# Patient Record
Sex: Female | Born: 1985 | Race: Black or African American | Hispanic: No | Marital: Single | Smoking: Current every day smoker
Health system: Southern US, Community
[De-identification: ages and names within clinical notes are randomized; demographics above are authoritative.]

## PROBLEM LIST (undated history)

## (undated) DIAGNOSIS — I10 Essential (primary) hypertension: Secondary | ICD-10-CM

## (undated) DIAGNOSIS — J45909 Unspecified asthma, uncomplicated: Secondary | ICD-10-CM

---
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
## 2005-02-03 ENCOUNTER — Other Ambulatory Visit: Admission: RE | Admit: 2005-02-03 | Discharge: 2005-02-03 | Payer: Self-pay | Admitting: Obstetrics and Gynecology

## 2005-04-06 ENCOUNTER — Inpatient Hospital Stay (HOSPITAL_COMMUNITY): Admission: AD | Admit: 2005-04-06 | Discharge: 2005-04-06 | Payer: Self-pay | Admitting: Obstetrics and Gynecology

## 2005-05-10 ENCOUNTER — Inpatient Hospital Stay (HOSPITAL_COMMUNITY): Admission: AD | Admit: 2005-05-10 | Discharge: 2005-05-14 | Payer: Self-pay | Admitting: Obstetrics and Gynecology

## 2006-10-22 ENCOUNTER — Emergency Department (HOSPITAL_COMMUNITY): Admission: EM | Admit: 2006-10-22 | Discharge: 2006-10-22 | Payer: Self-pay | Admitting: Emergency Medicine

## 2007-12-21 ENCOUNTER — Emergency Department (HOSPITAL_COMMUNITY): Admission: EM | Admit: 2007-12-21 | Discharge: 2007-12-21 | Payer: Self-pay | Admitting: Emergency Medicine

## 2008-06-04 ENCOUNTER — Emergency Department (HOSPITAL_COMMUNITY): Admission: EM | Admit: 2008-06-04 | Discharge: 2008-06-04 | Payer: Self-pay | Admitting: Emergency Medicine

## 2008-08-12 ENCOUNTER — Emergency Department (HOSPITAL_COMMUNITY): Admission: EM | Admit: 2008-08-12 | Discharge: 2008-08-12 | Payer: Self-pay | Admitting: Emergency Medicine

## 2009-11-15 ENCOUNTER — Inpatient Hospital Stay (HOSPITAL_COMMUNITY): Admission: AD | Admit: 2009-11-15 | Discharge: 2009-11-15 | Payer: Self-pay | Admitting: Obstetrics and Gynecology

## 2009-11-18 ENCOUNTER — Inpatient Hospital Stay (HOSPITAL_COMMUNITY): Admission: AD | Admit: 2009-11-18 | Discharge: 2009-11-20 | Payer: Self-pay | Admitting: Obstetrics and Gynecology

## 2009-11-18 ENCOUNTER — Encounter (INDEPENDENT_AMBULATORY_CARE_PROVIDER_SITE_OTHER): Payer: Self-pay | Admitting: Obstetrics and Gynecology

## 2010-02-20 IMAGING — CR DG CHEST 2V
2 series · 2 of 2 positions shown · non-contrast
Comparison: Report of a prior chest x-ray 04/18/2003

CLINICAL DATA: Cough

CHEST - 2 VIEW

[view not recorded (1 of 2)]
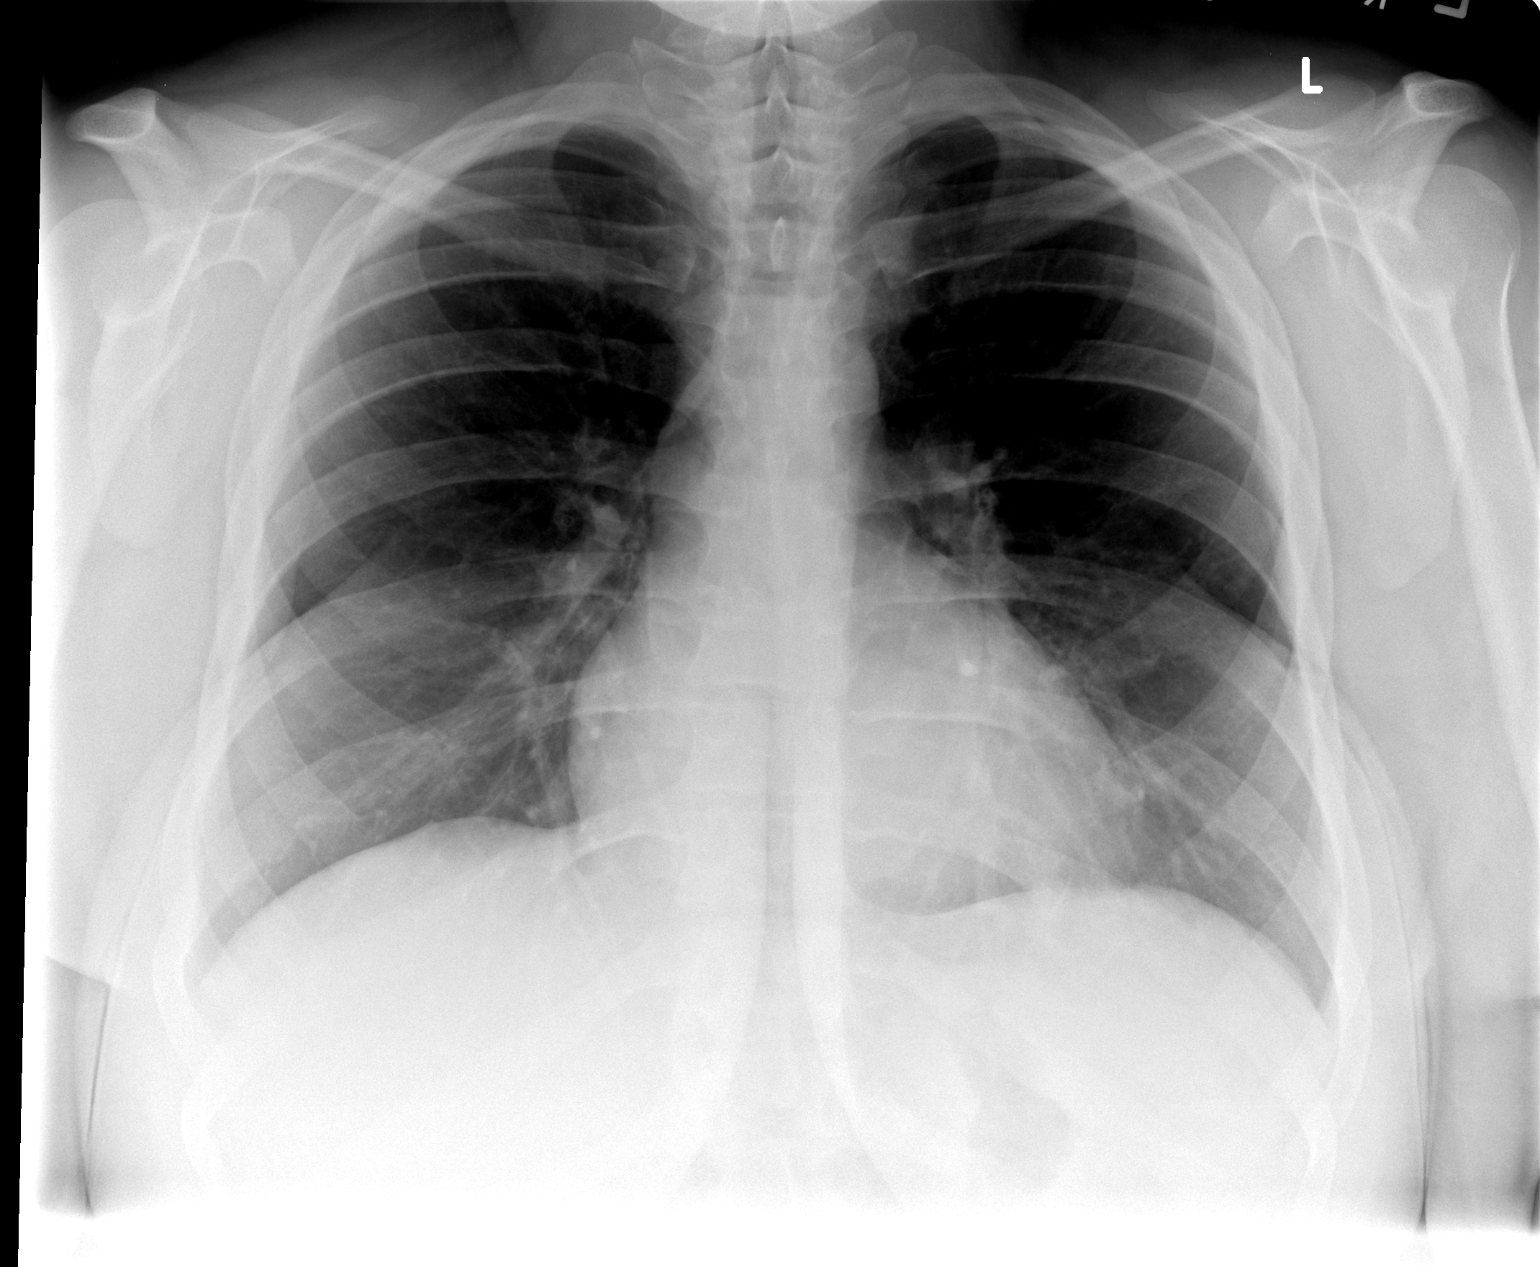

[view not recorded (2 of 2)]
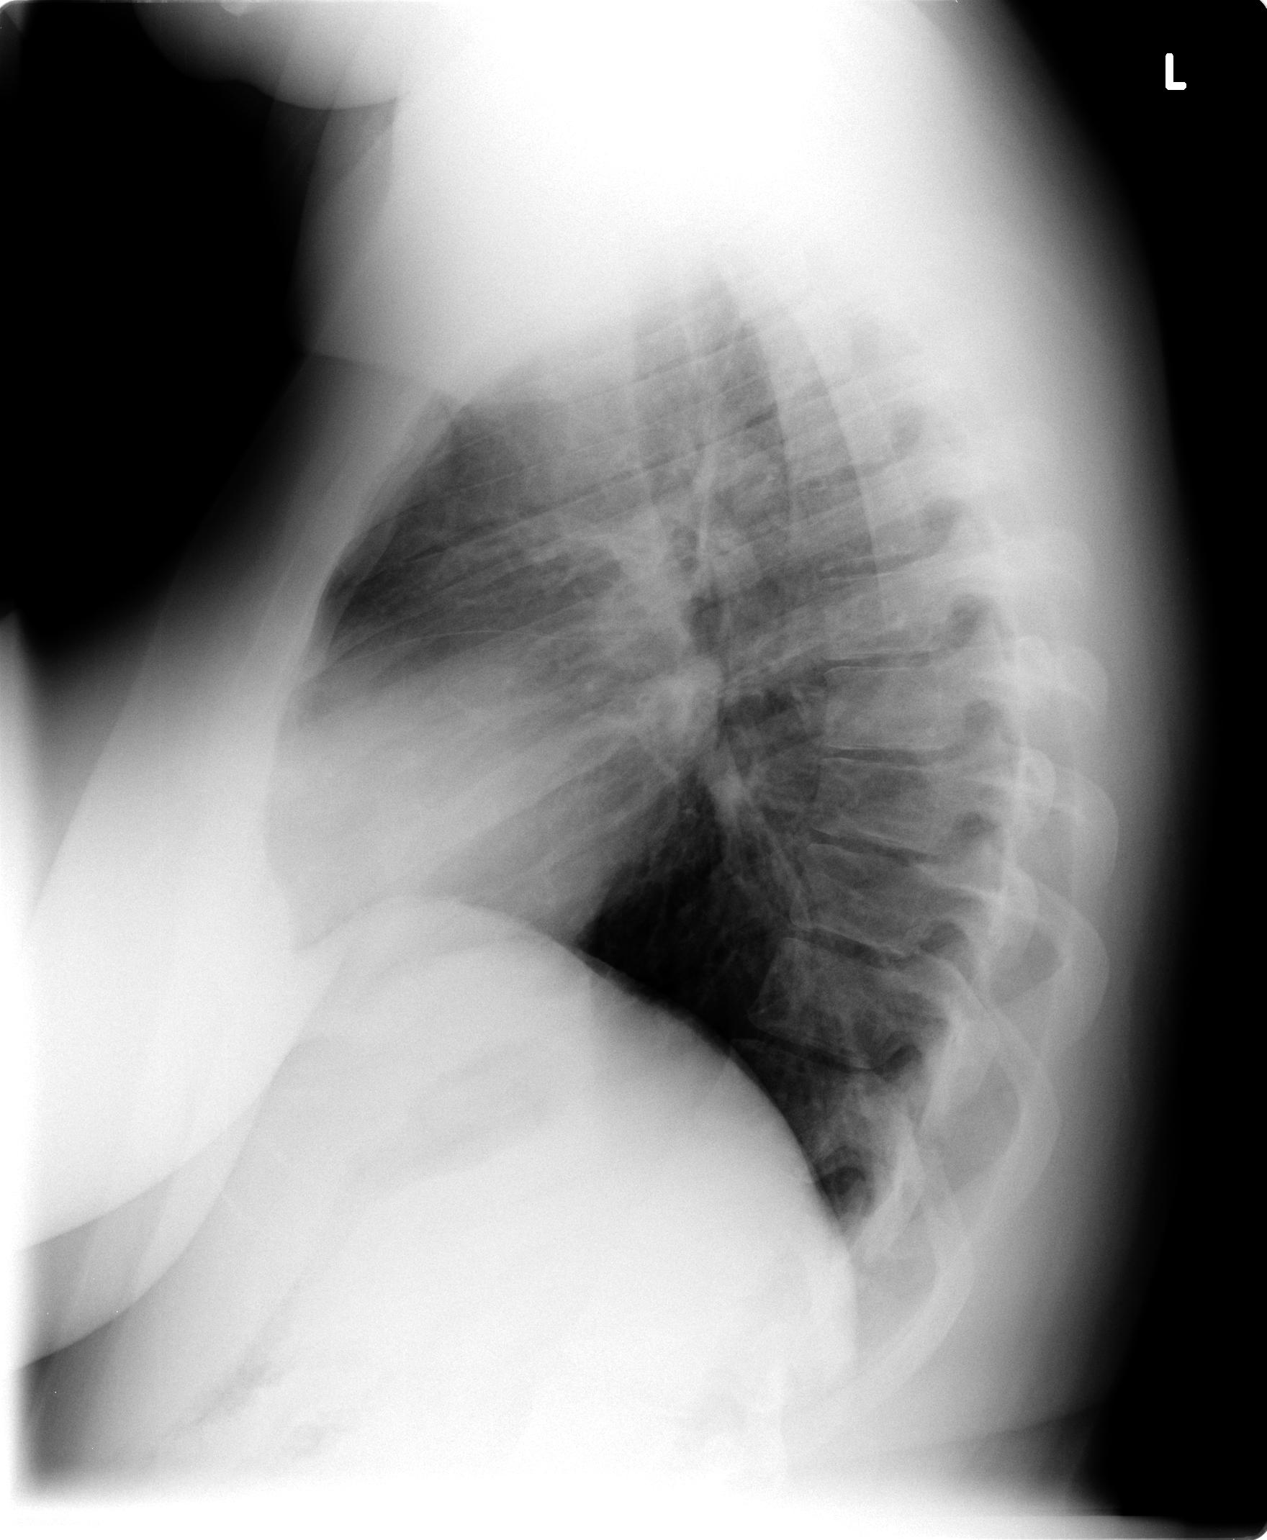

[2 of 2 positions shown; findings below may reference images not displayed]

FINDINGS: Heart and mediastinal contours normal.  Lungs clear.  No
pleural fluid.  Osseous structures and soft tissues unremarkable.
IMPRESSION: No acute or significant findings.

## 2010-06-13 LAB — URINALYSIS, ROUTINE W REFLEX MICROSCOPIC
Bilirubin Urine: NEGATIVE
Hgb urine dipstick: NEGATIVE
Ketones, ur: 15 mg/dL — AB
Protein, ur: NEGATIVE mg/dL
Specific Gravity, Urine: 1.025 (ref 1.005–1.030)
Urobilinogen, UA: 1 mg/dL (ref 0.0–1.0)

## 2010-06-13 LAB — CBC
Hemoglobin: 10.8 g/dL — ABNORMAL LOW (ref 12.0–15.0)
MCH: 29.8 pg (ref 26.0–34.0)
MCH: 31 pg (ref 26.0–34.0)
MCHC: 33.2 g/dL (ref 30.0–36.0)
MCHC: 34.3 g/dL (ref 30.0–36.0)
MCV: 89.5 fL (ref 78.0–100.0)
Platelets: 261 10*3/uL (ref 150–400)
RBC: 4.17 MIL/uL (ref 3.87–5.11)
RDW: 12.7 % (ref 11.5–15.5)

## 2010-07-08 LAB — BASIC METABOLIC PANEL
GFR calc non Af Amer: >60 mL/min (ref >60)
Potassium: 3.9 mEq/L (ref 3.5–5.1)
Sodium: 138 mEq/L (ref 135–145)

## 2010-07-08 LAB — URINALYSIS, ROUTINE W REFLEX MICROSCOPIC
Glucose, UA: NEGATIVE mg/dL
Hgb urine dipstick: NEGATIVE
Ketones, ur: NEGATIVE mg/dL
Protein, ur: NEGATIVE mg/dL

## 2010-07-08 LAB — PREGNANCY, URINE: Preg Test, Ur: NEGATIVE

## 2010-07-08 LAB — URINE MICROSCOPIC-ADD ON

## 2010-08-15 NOTE — Op Note (Signed)
Brandy [name], Brandy [name]              ACCOUNT NO.:  [id number]   MEDICAL RECORD NO.:  [id number]          PATIENT TYPE:  INP   LOCATION:  9119                          FACILITY:  [facility]   PHYSICIAN:  Crist Fat. Rivard, M.D. DATE OF BIRTH:  07/26/1985   DATE OF PROCEDURE:  05/11/2005  DATE OF DISCHARGE:                                 OPERATIVE REPORT   PREOPERATIVE DIAGNOSES:  1.  Intrauterine pregnancy at 40 weeks.  2.  Chronic hypertension.  3.  Nonreassuring fetal heart rate.   POSTOPERATIVE DIAGNOSES:  1.  Intrauterine pregnancy at 40 weeks.  2.  Chronic hypertension.  3.  Nonreassuring fetal heart rate.  4.  Body cord loop   PROCEDURE:  Primary low transverse cesarean section.   SURGEON:  Crist Fat. Rivard, M.D.   ASSISTANT:  Rhona Leavens, CNM   ESTIMATED BLOOD LOSS:  850 mL.   DESCRIPTION OF PROCEDURE:  After being informed of the planned procedure  with possible complications including bleeding, infection, injury to bowel,  bladder or ureters, informed consent was obtained.  The patient is taken to  OR #1 and preexisting epidural anesthesia is reinforced.  The patient is  prepped and draped in a sterile fashion and a Foley catheter is inserted in  her bladder.  After assessing adequate level of anesthesia, we infiltrated  the suprapubic area with 10 mL of Marcaine 0.25 and performed a Pfannenstiel  incision which was brought down sharply to the fascia.  The fascia is  incised in a low transverse fashion.  The linea alba is dissected.  The  abdomen is entered bluntly.  Visceral peritoneum is entered in a low  transverse fashion allowing [place] to safely retract bladder by developing a  bladder flap. Myometrium was entered in a low transverse fashion first with  knife and extended bluntly.  Amniotic fluid is clear.  We assisted the birth  of a female infant in occiput posterior position at 5:29 p.m.  Mouth and  nose were suctioned.  The baby is delivered while reducing a body  cord loop.  The cord is clamped with two Kelly clamps and sectioned, and the baby is  given to the pediatrician present in the room.  15 cc of blood is drawn from  the umbilical vein.  The patient is given a dose of Ancef 1 gram IV, and the  placenta is allowed to deliver spontaneously.  It is complete.  The cord has  three vessels, and the placenta is sent for cord blood donation.  Uterine  revision is negative. Myometrium was then repaired in two layers, first with  a running lock suture of 0 Vicryl, then with a Lembert suture of 0 Vicryl  imbricating the first one.  He did have a small extension of the incision  toward the lower cervix on a distance of about 1 cm which was easily  repaired through the normal lock suture.   Hemostasis was checked and completed with cautery on peritoneal edges.  Hemostasis was deemed adequate.  Both pericolic gutters were cleaned.  Both  tubes and ovaries assessed and normal.  Pelvis is profusely irrigated with  warm saline.  Hemostasis was adequate.  The fascia hemostasis is completed  with cautery and the fascia is closed with two running sutures of 1 Vicryl  meeting midline.  The incision is then irrigated with warm saline.  Hemostasis was completed with cautery and through a left contraincision a  #10 flat JP drain is placed and sutured to skin with 0 silk.  The skin of  the incision is then closed with a subcuticular suture of 3-0 Monocryl and  Steri-Strips.   Instruments and sponge count is complete x2.  Estimated blood loss is 850  cc.  The procedures were very well tolerated by the patient who is taken to  recovery room in a well and stable condition.   Little girl, name Vi'Caiyah, was born at 5:29 p.m. and received an Apgar of  9 at one minute and 9 at five minutes and weighs 6 pounds 13 ounces.      Crist Fat Rivard, M.D.  Electronically Signed     SAR/MEDQ  D:  05/11/2005  T:  05/11/2005  Job:  [id number]

## 2010-08-15 NOTE — H&P (Signed)
NAME:  Brandy [name], Brandy [name]              ACCOUNT NO.:  [id number]   MEDICAL RECORD NO.:  [id number]          PATIENT TYPE:  INP   LOCATION:  9167                          [facility]ACILITY:  [facility]   PHYSICIAN:  Naima A. Dillard, M.D. DATE OF BIRTH:  [birth year]   DATE OF ADMISSION:  05/10/2005  DATE OF DISCHARGE:                                HISTORY & PHYSICAL   Brandy [name] is a 25 year old gravida 1, para 0-0-0-0, who was admitted at 39  weeks and 6 days gestation for induction of labor secondary to chronic  hypertension on Aldomet.  The patient denies any leakage fluid, bleeding or  contractions.  The patient reports that her fetus was moving normally.  The  patient's pregnancy was remarkable for:  1.  Chronic hypertension diagnosed during pregnancy, started on Aldomet.  2.  Increased body mass index.  3.  History of asthma.  4.  History of Pap.   PRENATAL LABORATORY:  Hemoglobin 11.8, hematocrit 35.9 and platelets  293,000.  Blood type A positive, antibody screen negative, sickle cell trait  is negative, RPR is nonreactive, rubella titer immune, hepatitis B surface  antigen negative, HIV nonreactive, gonorrhea and Chlamydia negative.  Hemoglobin at 26 weeks 10.7, Glucola at 26 weeks 117.  Thirty-six weeks  group B strep was positive, gonorrhea and Chlamydia negative.   HISTORY OF PRESENT PREGNANCY:  The patient entered care at [redacted] weeks  gestation.  The patient's LMP of Aug 04, 2004 with an [facility] of May 11, 2005.  The patient underwent an ultrasound at 8 weeks and 5 days confirming  [facility] with the ultrasound [facility] May 10, 2005.  The patient's blood pressure  at first visit 160/78, repeat blood pressure at the next visit 140/100 and  160/101.  Therefore, the patient was started on Aldomet 500 mg b.i.d.  The  patient with a 24-hour protein and [redacted] weeks gestation 24-hour urine protein  48, remainder of labs were within normal limits.  Quad screen was within  normal limits.  Ultrasound at 18  weeks revealed size consistent with dates  fetus, a normal amniotic fluid volume, posterior placenta and no previa.  However, anatomy was limited secondary to maternal habitus.  Patient with  vulvovaginal candidiasis at 23 weeks.  Ultrasound repeated at 28 weeks with  an estimated fetal weight at the 78th percentile, a normal amniotic fluid  volume, vertex presentation.  The patient began biweekly NSTs at [redacted] weeks  gestation.  Biophysical profile secondary to nonreactive NST at 32 and 0/7  weeks, as well as 33 and 3/7 weeks.  Biophysical profile results 8/8 at this  time.  The patient with uterine contractions noted on the monitor at 34  weeks.  However, fetal fibronectin was found to be negative at that time, as  well as gonorrhea and Chlamydia.  Ultrasound repeated at 34 weeks with an  estimated fetal weight 57th percentile, a biophysical profile of 8/8 and  normal amniotic fluid.  NSTs remained reactive throughout the remainder of  the pregnancy.  Ultrasound obtained at 37 weeks revealing estimated fetal  weight of 45th percentile, normal amniotic  fluid and biophysical profile of  8/8.  The remainder of the patient's pregnancy has been unremarkable with  again biweekly NSTs with normal with reactive result.   OBSTETRIC HISTORY:  Pregnancy #1 current.   MEDICAL HISTORY:  A history of asthma and history of an increased body mass  index.   GYNECOLOGIC HISTORY:  The patient with a history of an abnormal Pap in  February of 2005 status post colposcopy.  Repeat Paps have been within  normal limits.  The patient denies any history of STDs.  The patient reports  regular monthly menses and denies any contraceptive use.   FAMILY HISTORY:  Father with hypertension, paternal grandmother COPD and  cancer of questionable type, and maternal grandmother colon cancer.   GENETIC HISTORY:  Paternal aunt sickle cell disease.  The paternal aunt and  uncle are twins, paternal uncle with mental  retardation.   SURGICAL HISTORY:  Negative.   ALLERGIES:  NO KNOWN DRUG ALLERGIES.   CURRENT MEDICATIONS:  1.  Aldomet 500 mg b.i.d.  2.  Prenatal vitamins daily.   SOCIAL HISTORY:  Patient is a single African-American female.  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  The father of the baby, Alecia Lemming  with 12 years of education, and is involved and supportive.  The patient  denies use of alcohol tobacco or street drugs.  The patient's domestic  violence screen is negative.  The patient is [employer] in her faith.   PHYSICAL EXAMINATION:  VITAL SIGNS:  The patient is afebrile.  Vital signs  stable.  Blood pressure 148/78.  HEENT:  Within normal limits.  LUNGS:  Clear.  HEART:  Regular rate and rhythm.  BREASTS:  Soft.  ABDOMEN:  Soft, gravid and nontender.  Fetal heart rate reactive with  accelerations noted.  No uterine contractions are noted.  CERVIX:  Deferred.  EXTREMITIES:  No edema and negative Homans' bilaterally.  Deep tendon  reflexes are 2+ and no clonus.   ASSESSMENT:  1.  Intrauterine pregnancy at 80 and 6/7 weeks.  2.  Chronic hypertension.  3.  Induction of labor.   PLAN:  1.  The patient admitted to birthing suites per orders of Dr. Dois Davenport Rivard.  2.  Orders per M.D.:  The patient will receive Cytotec on the evening of      admission, then Pitocin to be started on May 11, 2005 per protocol      and penicillin G for group B strep prophylaxis.      Rhona Leavens, CNM      Naima A. Normand Sloop, M.D.  Electronically Signed   NOS/MEDQ  D:  05/11/2005  T:  05/11/2005  Job:  [id number]

## 2010-08-15 NOTE — H&P (Signed)
Brandy [name], Brandy [name]                        ACCOUNT NO.:  [id number]   MEDICAL RECORD NO.:  [id number]                   PATIENT TYPE:  INP   LOCATION:  A312                                 FACILITY:  [facility]   PHYSICIAN:  Donna Bernard, M.D.             DATE OF BIRTH:  02-27-1986   DATE OF ADMISSION:  04/18/2003  DATE OF DISCHARGE:                                HISTORY & PHYSICAL   CHIEF COMPLAINT:  Cannot breathe.   SUBJECTIVE:  This patient is a 25 year old black female with a history of  early childhood asthma who presents to the emergency room on the day of  admission with complaints of severe shortness of breath.  The patient  developed cold, congestion, and drainage several days ago.  This was  followed soon by a steady cough.  The last 24 hours the patient has noted  shortness of breath steadily worsening. She has had tightness and wheezing.  She has had a difficult time sleeping last night.  She was brought in  through the emergency room and found to be in a fair amount of distress with  her wheezing.   ALLERGIES:  None known.   CHRONIC MEDICATIONS:  None.   SOCIAL HISTORY:  The patient lives with father.  A senior in school.  Also  significant for second-hand smoke exposure.   FAMILY HISTORY:  Positive for wheezing.   REVIEW OF SYSTEMS:  Otherwise negative.   PHYSICAL EXAMINATION:  VITAL SIGNS:  Afebrile.  BP 130/70, respiratory rate  36 breaths per minute initially.  GENERAL:  The patient is alert with mild-to-moderate tachypnea.  HEENT:  Moderate nasal congestion.  Pharynx normal.  TMs normal.  NECK:  Supple.  LUNGS:  Impressive bilateral wheezes, some rhonchi.  Mild tachypnea and  positive for accessory muscle use.  HEART:  Tachycardia noted.  ABDOMEN:  Soft.  EXTREMITIES:  Normal.  SKIN:  Normal.   SIGNIFICANT LABS:  CBC pending.  Blood gas pO2 of 58 pCO2 of 33 on no oxygen  supplementation.  Chest x-ray no obvious infiltrate.  Urine pregnancy test  negative.  CBC pending as noted.   IMPRESSION:  Probable viral syndrome with possible element of bronchitis and  exacerbation of asthma.  Of interest, the patient has history of early  childhood asthma, but has not had any significant wheezing for a number of  years.   PLAN:  IV antibiotics, steroids, fluids, frequent nebulizer treatments,  oxygen saturation monitoring, oxygen supplementation.  Further orders as  noted on the chart.     ___________________________________________                                         Donna Bernard, M.D.   WSL/MEDQ  D:  04/18/2003  T:  04/18/2003  Job:  [id number]

## 2010-08-15 NOTE — Discharge Summary (Signed)
Brandy [name], Brandy [name]                        ACCOUNT NO.:  [id number]   MEDICAL RECORD NO.:  [id number]                   PATIENT TYPE:  INP   LOCATION:  A312                                 FACILITY:  [facility]   PHYSICIAN:  Donna Bernard, M.D.             DATE OF BIRTH:  [birth year]   DATE OF ADMISSION:  04/18/2003  DATE OF DISCHARGE:  04/20/2003                                 DISCHARGE SUMMARY   FINAL DIAGNOSES:  1. Exacerbation of asthma.  2. Bronchitis.   FINAL DISPOSITION:  The patient discharged to home.   DISCHARGE MEDICATIONS:  1. Prednisone taper prescribed.  2. Ceftin 250 b.i.d. for seven days.  3. Albuterol two puffs q.4h. while wheezing and then p.r.n.   FOLLOW UP:  She is to follow up with Dr. Orson Aloe in one week.   INITIAL HISTORY AND PHYSICAL:  Please see H&P as dictated.   HOSPITAL COURSE:  This patient is a 25 year old black female, patient of Dr.  Orson Aloe, who presented the day of admission with severe shortness of  breath and wheezing.  She had known history of prior asthma.  She had a  several day prodrome of congestion and cough.  The cough was productive.   Workup in the emergency room revealed true hypoxia.  The patient had  multiple nebulizer treatments with some improvement.  Oxygen was  administered.  The patient was admitted to the hospital.  She was given IV  antibiotics, IV steroids and frequent nebulizer treatments.  Over the next  48 hours, she improved significantly.  The day of discharge is feeling  better.  She still has mild wheezes but was felt stable enough to head home.  The patient was discharged home with diagnosis and disposition as noted  above.     ___________________________________________                                         Donna Bernard, M.D.   WSL/MEDQ  D:  04/29/2003  T:  04/29/2003  Job:  [id number]

## 2010-08-15 NOTE — Discharge Summary (Signed)
NAME:  [name], Brandy              ACCOUNT NO.:  [id number]   MEDICAL RECORD NO.:  [id number]          PATIENT TYPE:  INP   LOCATION:  9119                          [facility]AC[facility]   PHYSICIAN:  Naima A. Dillard, M.D. DATE OF BIRTH:  02-22-1986   DATE OF ADMISSION:  05/10/2005  DATE OF DISCHARGE:  05/14/2005                                 DISCHARGE SUMMARY   ADMISSION DIAGNOSES:  1.  Intrauterine pregnancy at 73 and 6/[redacted] weeks gestation.  2.  Chronic hypertension.  3.  Elective induction of labor.   DISCHARGE DIAGNOSES:  1.  Intrauterine pregnancy at 33 and 6/[redacted] weeks gestation.  2.  Chronic hypertension.  3.  Elective induction of labor.  4.  Fetal heart rate decelerations.   HOSPITAL PROCEDURES:  1.  Cytotec induction of labor.  2.  Amniotomy.  3.  Pitocin augmentation of labor.  4.  Amnioinfusion.  5.  Epidural anesthesia.  6.  Primary low-transverse cesarean section.   HOSPITAL COURSE:  The patient was admitted for induction of labor secondary  to hypertension.  She was 2 to 3 cm on admission.  Amniotomy was performed  and she received 2 doses of Cytotec and then was started on Pitocin.  She  progressed to complete dilation.  Later that afternoon, was found to be in  OP presentation, pushed for some time with development of fetal heart rate  decelerations into the 50s and 60s with slow recovery, which did not improve  despite amnioinfusion and position changes.  The baby got to 0 station and  descended no further, and the decision was made to proceed with C-section,  which was performed under epidural anesthesia by Dr. Estanislado Pandy.  The baby was  found to have a cord around the body.  She was delivered of a female infant,  Apgars 9 and 9 weighing 6 pounds 13 ounces.  EBL was 850 mL.  She was taken  to recovery and then to mother-baby unit where she did well.  On postop day  #1, hemoglobin was 9.9, JP was draining a small amount, blood pressure was  150/85 and 150/90 on  Aldomet, and routine care was given.  On postop day #2,  she was doing well, blood pressures were 124-138/63-86, JP drained small  amount, pain was well controlled, she was ambulating and taking foods  without problems.  Postop day #3, she continued to improve and was ready to  go home.  Vital signs were stable and afebrile.  Abdomen was soft and  appropriately tender.  Chest was clear.  Heart rate:  Regular rate and  rhythm.  The incision was clean, dry and intact.  JP was removed by Dr.  Stefano Gaul and the patient was deemed to have received full benefit of her  hospital stay, and was discharged home.   DISCHARGE MEDICATIONS:  1.  Tylox 1 to 2 p.o. q.4h. p.r.n.  2.  Motrin 600 mg p.o. q.6h. p.r.n.   DISCHARGE LABORATORY:  White blood cell count 14, hemoglobin 9.9 and  platelets 212.  Chemistries were within normal limits.  Uric acid 6.2 and  AST was 19, ALT was 11.   DISCHARGE INSTRUCTIONS:  Per [facility] handout.  Discharge followup in 6 weeks or  p.r.n.      Brandy [name], C.N.M.      Naima A. Normand Sloop, M.D.  Electronically Signed    MLW/MEDQ  D:  05/14/2005  T:  05/14/2005  Job:  [id number]

## 2014-06-25 ENCOUNTER — Emergency Department (HOSPITAL_COMMUNITY)
Admission: EM | Admit: 2014-06-25 | Discharge: 2014-06-25 | Disposition: A | Discharge status: Home / Self Care | Payer: 59 | Attending: Emergency Medicine | Admitting: Emergency Medicine

## 2014-06-25 ENCOUNTER — Encounter (HOSPITAL_COMMUNITY): Payer: Self-pay | Admitting: Emergency Medicine

## 2014-06-25 DIAGNOSIS — I10 Essential (primary) hypertension: Secondary | ICD-10-CM | POA: Diagnosis not present

## 2014-06-25 DIAGNOSIS — R350 Frequency of micturition: Secondary | ICD-10-CM | POA: Diagnosis not present

## 2014-06-25 DIAGNOSIS — M7989 Other specified soft tissue disorders: Secondary | ICD-10-CM | POA: Insufficient documentation

## 2014-06-25 DIAGNOSIS — M545 Low back pain, unspecified: Secondary | ICD-10-CM

## 2014-06-25 DIAGNOSIS — Z720 Tobacco use: Secondary | ICD-10-CM | POA: Insufficient documentation

## 2014-06-25 DIAGNOSIS — J45909 Unspecified asthma, uncomplicated: Secondary | ICD-10-CM | POA: Diagnosis not present

## 2014-06-25 HISTORY — DX: Unspecified asthma, uncomplicated: J45.909

## 2014-06-25 HISTORY — DX: Essential (primary) hypertension: I10

## 2014-06-25 LAB — URINALYSIS, ROUTINE W REFLEX MICROSCOPIC
Bilirubin Urine: NEGATIVE
Glucose, UA: NEGATIVE mg/dL
Hgb urine dipstick: NEGATIVE
Ketones, ur: NEGATIVE mg/dL
Nitrite: NEGATIVE
Protein, ur: NEGATIVE mg/dL
Specific Gravity, Urine: >1.03 — ABNORMAL HIGH (ref 1.005–1.030)
Urobilinogen, UA: 0.2 mg/dL (ref 0.0–1.0)
pH: 6 (ref 5.0–8.0)

## 2014-06-25 LAB — URINE MICROSCOPIC-ADD ON

## 2014-06-25 MED ORDER — TRAMADOL HCL 50 MG PO TABS: 50.0000 mg | ORAL_TABLET | Freq: Four times a day (QID) | ORAL | Status: DC | PRN

## 2014-06-25 MED ORDER — NAPROXEN 375 MG PO TABS: 375.0000 mg | ORAL_TABLET | Freq: Two times a day (BID) | ORAL | Status: DC | PRN

## 2014-06-25 MED ORDER — DIAZEPAM 5 MG PO TABS
5.0000 mg | ORAL_TABLET | Freq: Once | ORAL | Status: AC
  Administered 2014-06-25: 5 mg via ORAL
  Filled 2014-06-25: qty 1

## 2014-06-25 MED ORDER — OXYCODONE-ACETAMINOPHEN 5-325 MG PO TABS
2.0000 | ORAL_TABLET | Freq: Once | ORAL | Status: AC
  Administered 2014-06-25: 2 via ORAL
  Filled 2014-06-25: qty 2

## 2014-06-25 MED ORDER — IBUPROFEN 800 MG PO TABS
800.0000 mg | ORAL_TABLET | Freq: Once | ORAL | Status: AC
  Administered 2014-06-25: 800 mg via ORAL
  Filled 2014-06-25: qty 1

## 2014-06-25 MED ORDER — CYCLOBENZAPRINE HCL 10 MG PO TABS: 10.0000 mg | ORAL_TABLET | Freq: Three times a day (TID) | ORAL | Status: DC | PRN

## 2014-06-25 NOTE — Discharge Instructions (Signed)
Back Pain, Adult  Get your blood pressure rechecked at your scheduled doctor's office visit next week. Today's was elevated at 163/93 Low back pain is very common. About 1 in 5 people have back pain.The cause of low back pain is rarely dangerous. The pain often gets better over time.About half of people with a sudden onset of back pain feel better in just 2 weeks. About 8 in 10 people feel better by 6 weeks.  CAUSES Some common causes of back pain include:  Strain of the muscles or ligaments supporting the spine.  Wear and tear (degeneration) of the spinal discs.  Arthritis.  Direct injury to the back. DIAGNOSIS Most of the time, the direct cause of low back pain is not known.However, back pain can be treated effectively even when the exact cause of the pain is unknown.Answering your caregiver's questions about your overall health and symptoms is one of the most accurate ways to make sure the cause of your pain is not dangerous. If your caregiver needs more information, he or she may order lab work or imaging tests (X-rays or MRIs).However, even if imaging tests show changes in your back, this usually does not require surgery. HOME CARE INSTRUCTIONS For many people, back pain returns.Since low back pain is rarely dangerous, it is often a condition that people can learn to [facility] LLCmanageon their own.   Remain active. It is stressful on the back to sit or stand in one place. Do not sit, drive, or stand in one place for more than 30 minutes at a time. Take short walks on level surfaces as soon as pain allows.Try to increase the length of time you walk each day.  Do not stay in bed.Resting more than 1 or 2 days can delay your recovery.  Do not avoid exercise or work.Your body is made to move.It is not dangerous to be active, even though your back may hurt.Your back will likely heal faster if you return to being active before your pain is gone.  Pay attention to your body when you bend and  lift. Many people have less discomfortwhen lifting if they bend their knees, keep the load close to their bodies,and avoid twisting. Often, the most comfortable positions are those that put less stress on your recovering back.  Find a comfortable position to sleep. Use a firm mattress and lie on your side with your knees slightly bent. If you lie on your back, put a pillow under your knees.  Only take over-the-counter or prescription medicines as directed by your caregiver. Over-the-counter medicines to reduce pain and inflammation are often the most helpful.Your caregiver may prescribe muscle relaxant drugs.These medicines help dull your pain so you can more quickly return to your normal activities and healthy exercise.  Put ice on the injured area.  Put ice in a plastic bag.  Place a towel between your skin and the bag.  Leave the ice on for 15-20 minutes, 03-04 times a day for the first 2 to 3 days. After that, ice and heat may be alternated to reduce pain and spasms.  Ask your caregiver about trying back exercises and gentle massage. This may be of some benefit.  Avoid feeling anxious or stressed.Stress increases muscle tension and can worsen back pain.It is important to recognize when you are anxious or stressed and learn ways to manage it.Exercise is a great option. SEEK MEDICAL CARE IF:  You have pain that is not relieved with rest or medicine.  You have pain that  does not improve in 1 week.  You have new symptoms.  You are generally not feeling well. SEEK IMMEDIATE MEDICAL CARE IF:   You have pain that radiates from your back into your legs.  You develop new bowel or bladder control problems.  You have unusual weakness or numbness in your arms or legs.  You develop nausea or vomiting.  You develop abdominal pain.  You feel faint. Document Released: 03/16/2005 Document Revised: 09/15/2011 Document Reviewed: 07/18/2013 [facility] Patient Information 2015 [place],  [place]. This information is not intended to replace advice given to you by your health care provider. Make sure you discuss any questions you have with your health care provider.

## 2014-06-25 NOTE — ED Provider Notes (Signed)
CSN: [id number]     Arrival date & time 06/25/14  1343 History   This chart was scribed for Brandy RazorStephen Bea Duren, MD by Tonye RoyaltyJoshua Chen, ED Scribe. This patient was seen in room APA11/APA11 and the patient's care was started at 2:54 PM.    Chief Complaint  Patient presents with  . Back Pain   The history is provided by the patient. No language interpreter was used.    HPI Comments: Brandy [name] is a 29 y.o. female who presents to the Emergency Department complaining of low back pain with onset 2 days ago. She suspected she slept wrong, but states pain has been persistent. She describes it as sharp pain with spasms. She states it is worse when walking and deep breath and states she gets stiff when sitting for too long. She reports urinary frequency but denies dysuria or difficulty urinating. She notes her feet were swollen last week and had pain to left lateral lower leg, but that has resolved now. She used muscle relaxer prescribed to her sister with improvement. She denies prior back surgery. She denies fever, chills, or numbness/tingling.  Past Medical History  Diagnosis Date  . Asthma   . Hypertension    History reviewed. No pertinent past surgical history. History reviewed. No pertinent family history. History  Substance Use Topics  . Smoking status: Current Every Day Smoker    Types: Cigars  . Smokeless tobacco: Not on file  . Alcohol Use: Yes   OB History    No data available     Review of Systems  Constitutional: Negative for chills.  Cardiovascular: Positive for leg swelling.  Genitourinary: Positive for frequency. Negative for dysuria and difficulty urinating.  Musculoskeletal: Positive for back pain.  Neurological: Negative for numbness.  All other systems reviewed and are negative.     Allergies  Review of patient's allergies indicates not on file.  Home Medications   Prior to Admission medications   Not on File   BP 187/89 mmHg  Pulse 75  Temp(Src) 98.3 F  (36.8 C) (Oral)  Resp 20  Ht 5\' 9"  (1.753 m)  Wt 260 lb (117.935 kg)  BMI 38.38 kg/m2  SpO2 100%  LMP 06/04/2014 Physical Exam  Constitutional: She appears well-developed and well-nourished. No distress.  HENT:  Head: Normocephalic and atraumatic.  Eyes: Conjunctivae are normal. Right eye exhibits no discharge. Left eye exhibits no discharge.  Neck: Neck supple.  Cardiovascular: Normal rate, regular rhythm and normal heart sounds.  Exam reveals no gallop and no friction rub.   No murmur heard. Pulmonary/Chest: Effort normal and breath sounds normal. No respiratory distress.  Abdominal: Soft. She exhibits no distension. There is no tenderness.  Musculoskeletal: She exhibits no edema or tenderness.  Mild left lumbar tenderness extending to midline No overlying skin changes Strength normal in lower extremities Sensation intact to light touch  Neurological: She is alert.  Skin: Skin is warm and dry.  Psychiatric: She has a normal mood and affect. Her behavior is normal. Thought content normal.  Nursing note and vitals reviewed.   ED Course  Procedures (including critical care time)  DIAGNOSTIC STUDIES: Oxygen Saturation is 100% on room air, normal by my interpretation.    COORDINATION OF CARE: 2:58 PM Discussed treatment plan with patient at beside, the patient agrees with the plan and has no further questions at this time.   Labs Review Labs Reviewed - No data to display  Imaging Review No results found.   EKG Interpretation None  MDM   Final diagnoses:  Left-sided low back pain without sciatica  Midline low back pain without sciatica    29 year old female with atraumatic left lower back pain. Nonfocal neurological examination. Endorses some increased urinary frequency, otherwise no urinary complaints. Doubt acute neurological emergency. Plan symptomatically treatment. Antibiotics as indicated pending urinalysis results.  I personally preformed the services  scribed in my presence. The recorded information has been reviewed is accurate. Brandy Razor, MD.   Brandy Razor, MD 06/27/14 [phone]

## 2014-06-25 NOTE — ED Notes (Signed)
Having lower back pain for 2 days.  Rates pain 8/10.  Have not taken any med today.

## 2014-06-25 NOTE — ED Provider Notes (Signed)
Complains of low nonradiating low back pain onset(points to presacral area) yesterday. Pain worse with changing positions improved with remaining still. She was treated with a muscle relaxer given to her by her sister last night She feels improved since treatment here. She denies urinary symptoms. Urinalysis is contaminated but don't feel that repeat specimen needed in that patient has no urinary symptoms. Pain felt to be musculoskeletal in etiology. Patient also advised to get blood pressure rechecked in one week. She has a plan with her OB/GYN in one week Results for orders placed or performed during the hospital encounter of 06/25/14  Urinalysis, Routine w reflex microscopic  Result Value Ref Range   Color, Urine YELLOW YELLOW   APPearance CLEAR CLEAR   Specific Gravity, Urine >1.030 (H) 1.005 - 1.030   pH 6.0 5.0 - 8.0   Glucose, UA NEGATIVE NEGATIVE mg/dL   Hgb urine dipstick NEGATIVE NEGATIVE   Bilirubin Urine NEGATIVE NEGATIVE   Ketones, ur NEGATIVE NEGATIVE mg/dL   Protein, ur NEGATIVE NEGATIVE mg/dL   Urobilinogen, UA 0.2 0.0 - 1.0 mg/dL   Nitrite NEGATIVE NEGATIVE   Leukocytes, UA TRACE (A) NEGATIVE  Urine microscopic-add on  Result Value Ref Range   Squamous Epithelial / LPF MANY (A) RARE   WBC, UA 3-6 <3 WBC/hpf   Bacteria, UA FEW (A) RARE   No results found.   Doug SouSam Tionne Carelli, MD 06/25/14 [phone]

## 2014-08-02 ENCOUNTER — Encounter (HOSPITAL_COMMUNITY): Payer: Self-pay | Admitting: Emergency Medicine

## 2014-08-02 ENCOUNTER — Emergency Department (HOSPITAL_COMMUNITY)
Admission: EM | Admit: 2014-08-02 | Discharge: 2014-08-02 | Disposition: A | Discharge status: Home / Self Care | Payer: 59 | Attending: Emergency Medicine | Admitting: Emergency Medicine

## 2014-08-02 ENCOUNTER — Emergency Department (HOSPITAL_COMMUNITY): Payer: 59

## 2014-08-02 DIAGNOSIS — I159 Secondary hypertension, unspecified: Secondary | ICD-10-CM | POA: Insufficient documentation

## 2014-08-02 DIAGNOSIS — Y9389 Activity, other specified: Secondary | ICD-10-CM | POA: Insufficient documentation

## 2014-08-02 DIAGNOSIS — S29001A Unspecified injury of muscle and tendon of front wall of thorax, initial encounter: Secondary | ICD-10-CM | POA: Insufficient documentation

## 2014-08-02 DIAGNOSIS — S199XXA Unspecified injury of neck, initial encounter: Secondary | ICD-10-CM | POA: Diagnosis present

## 2014-08-02 DIAGNOSIS — Z720 Tobacco use: Secondary | ICD-10-CM | POA: Diagnosis not present

## 2014-08-02 DIAGNOSIS — Y92410 Unspecified street and highway as the place of occurrence of the external cause: Secondary | ICD-10-CM | POA: Insufficient documentation

## 2014-08-02 DIAGNOSIS — Y998 Other external cause status: Secondary | ICD-10-CM | POA: Insufficient documentation

## 2014-08-02 DIAGNOSIS — S161XXA Strain of muscle, fascia and tendon at neck level, initial encounter: Secondary | ICD-10-CM | POA: Insufficient documentation

## 2014-08-02 DIAGNOSIS — R52 Pain, unspecified: Secondary | ICD-10-CM

## 2014-08-02 DIAGNOSIS — J45909 Unspecified asthma, uncomplicated: Secondary | ICD-10-CM | POA: Insufficient documentation

## 2014-08-02 DIAGNOSIS — S299XXA Unspecified injury of thorax, initial encounter: Secondary | ICD-10-CM

## 2014-08-02 MED ORDER — CYCLOBENZAPRINE HCL 10 MG PO TABS
5.0000 mg | ORAL_TABLET | Freq: Once | ORAL | Status: AC
  Administered 2014-08-02: 5 mg via ORAL
  Filled 2014-08-02: qty 1

## 2014-08-02 MED ORDER — IBUPROFEN 200 MG PO TABS
600.0000 mg | ORAL_TABLET | Freq: Once | ORAL | Status: AC
  Administered 2014-08-02: 600 mg via ORAL
  Filled 2014-08-02: qty 3

## 2014-08-02 MED ORDER — IBUPROFEN 600 MG PO TABS: 600.0000 mg | ORAL_TABLET | Freq: Three times a day (TID) | ORAL | Status: DC

## 2014-08-02 MED ORDER — CYCLOBENZAPRINE HCL 5 MG PO TABS: 5.0000 mg | ORAL_TABLET | Freq: Three times a day (TID) | ORAL | Status: DC
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
## 2014-08-02 NOTE — ED Notes (Signed)
About pt's elevated blood pressure after taking it twice on different upper extremities, pt states that she was told she "high blood pressure with pregnancy"... States "they did not put me on any meds, they said they will just watch it" I have notified the EDP
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
## 2014-08-02 NOTE — Discharge Instructions (Signed)
Your  x-rays are all normal.  You have been given prescriptions for pain control as well as muscle relaxer.  You do have hypertension documented here.  Please follow-up with your primary care physician.  This measures just be related to pain and anxiety, but it needs to be monitored more carefully

## 2017-02-22 ENCOUNTER — Emergency Department (HOSPITAL_COMMUNITY): Payer: Managed Care, Other (non HMO)

## 2017-02-22 ENCOUNTER — Emergency Department (HOSPITAL_COMMUNITY)
Admission: EM | Admit: 2017-02-22 | Discharge: 2017-02-22 | Disposition: A | Discharge status: Home / Self Care | Payer: Managed Care, Other (non HMO) | Attending: Emergency Medicine | Admitting: Emergency Medicine

## 2017-02-22 ENCOUNTER — Encounter (HOSPITAL_COMMUNITY): Payer: Self-pay | Admitting: Emergency Medicine

## 2017-02-22 DIAGNOSIS — Z79899 Other long term (current) drug therapy: Secondary | ICD-10-CM | POA: Diagnosis not present

## 2017-02-22 DIAGNOSIS — I10 Essential (primary) hypertension: Secondary | ICD-10-CM

## 2017-02-22 DIAGNOSIS — R0981 Nasal congestion: Secondary | ICD-10-CM | POA: Diagnosis not present

## 2017-02-22 DIAGNOSIS — J069 Acute upper respiratory infection, unspecified: Secondary | ICD-10-CM | POA: Diagnosis not present

## 2017-02-22 DIAGNOSIS — R05 Cough: Secondary | ICD-10-CM | POA: Diagnosis present

## 2017-02-22 DIAGNOSIS — D500 Iron deficiency anemia secondary to blood loss (chronic): Secondary | ICD-10-CM | POA: Diagnosis not present

## 2017-02-22 DIAGNOSIS — F17210 Nicotine dependence, cigarettes, uncomplicated: Secondary | ICD-10-CM | POA: Diagnosis not present

## 2017-02-22 DIAGNOSIS — J45909 Unspecified asthma, uncomplicated: Secondary | ICD-10-CM | POA: Insufficient documentation

## 2017-02-22 LAB — BASIC METABOLIC PANEL
ANION GAP: 7 (ref 5–15)
BUN: 10 mg/dL (ref 6–20)
CALCIUM: 8.6 mg/dL — AB (ref 8.9–10.3)
CO2: 26 mmol/L (ref 22–32)
Chloride: 102 mmol/L (ref 101–111)
Creatinine, Ser: 0.71 mg/dL (ref 0.44–1.00)
Glucose, Bld: 92 mg/dL (ref 65–99)
Potassium: 3.5 mmol/L (ref 3.5–5.1)
SODIUM: 135 mmol/L (ref 135–145)

## 2017-02-22 LAB — CBC WITH DIFFERENTIAL/PLATELET
BASOS ABS: 0 10*3/uL (ref 0.0–0.1)
Basophils Relative: 0 %
Eosinophils Absolute: 0.2 10*3/uL (ref 0.0–0.7)
Eosinophils Relative: 2 %
HEMATOCRIT: 25.2 % — AB (ref 36.0–46.0)
HEMOGLOBIN: 6.9 g/dL — AB (ref 12.0–15.0)
LYMPHS PCT: 30 %
Lymphs Abs: 3.1 10*3/uL (ref 0.7–4.0)
MCH: 18.8 pg — ABNORMAL LOW (ref 26.0–34.0)
MCHC: 27.4 g/dL — AB (ref 30.0–36.0)
MCV: 68.5 fL — ABNORMAL LOW (ref 78.0–100.0)
MONOS PCT: 6 %
Monocytes Absolute: 0.6 10*3/uL (ref 0.1–1.0)
NEUTROS ABS: 6.4 10*3/uL (ref 1.7–7.7)
Neutrophils Relative %: 62 %
Platelets: 475 10*3/uL — ABNORMAL HIGH (ref 150–400)
RBC: 3.68 MIL/uL — AB (ref 3.87–5.11)
RDW: 18.2 % — ABNORMAL HIGH (ref 11.5–15.5)
WBC: 10.3 10*3/uL (ref 4.0–10.5)

## 2017-02-22 MED ORDER — HYDROCHLOROTHIAZIDE 25 MG PO TABS: 25.0000 mg | ORAL_TABLET | Freq: Every day | ORAL | 0 refills | Status: DC

## 2017-02-22 NOTE — ED Notes (Signed)
Patient transported to X-ray 

## 2017-02-22 NOTE — Discharge Instructions (Signed)
Take an iron pill twice a day for at least 1 month, for the anemia. To prevent constipation, take a colace tablet 1-2 times a day.   Try to avoid added salt in your diet. Start taking the blood pressure medication as soon as possible.  For cough, use Robitussin DM.  It is important to follow up with a primary care doctor in 2-3 weeks to guide your care.

## 2017-02-22 NOTE — ED Provider Notes (Signed)
[facility]GENCY [facility]PART[facility] Provider Note   CSN: [id number] Arrival date & time: 02/22/17  1558     History   Chief Complaint Chief Complaint  Patient presents with  . Nasal Congestion  . Hypertension    HPI Brandy [name] is a 31 y.o. female.  She is here for evaluation of general malaise, for several days associated with cough which is nonproductive, and concern for elevated blood pressure.  She has been following her blood pressure at home and states that it has been high but she does not know what the numbers are.  She has been unable to work for 2 days because of feeling badly.  She is using an over-the-counter cough medication.  She states that she has known about high blood pressure for many years and was hypertensive during her pregnancy 7 years ago.  At that time she took medications but has not taking any medication for hypertension, since.  She reports having heavy menses, at least 7 days each month and sometimes has intermenstrual bleeding for several days in the last several months.  She denies syncope, chest pain, shortness of breath, weakness or paresthesia.  There are no other known modifying factors.    HPI  Past Medical History:  Diagnosis Date  . Asthma   . Hypertension     There are no active problems to display for this patient.   History reviewed. No pertinent surgical history.  OB History    No data available       Home Medications    Prior to Admission medications   Medication Sig Start Date End Date Taking? Authorizing Provider  Phenyleph-CPM-DM-Aspirin (ALKA-SELTZER PLUS COLD & COUGH) 7.10-29-08-325 MG TBEF Take 1 tablet by mouth once.   Yes [provider]  hydrochlorothiazide (HYDRODIURIL) 25 MG tablet Take 1 tablet (25 mg total) by mouth daily. 02/22/17   Mancel BaleWentz, Sirinity Outland, MD    Family History No family history on file.  Social History Social History   Tobacco Use  . Smoking status: Current Every Day Smoker    Types:  Cigars  . Smokeless tobacco: Never Used  Substance Use Topics  . Alcohol use: Yes  . Drug use: No     Allergies   Patient has no known allergies.   Review of Systems Review of Systems  All other systems reviewed and are negative.    Physical Exam Updated Vital Signs BP (!) 162/98   Pulse 66   Temp 97.8 F (36.6 C) (Oral)   Resp 18   Ht 5' 8.5" (1.74 m)   Wt 99.8 kg (220 lb)   LMP 02/18/2017   SpO2 100%   BMI 32.96 kg/m   Physical Exam  Constitutional: She is oriented to person, place, and time. She appears well-developed and well-nourished. No distress.  HENT:  Head: Normocephalic and atraumatic.  Eyes: Conjunctivae and EOM are normal. Pupils are equal, round, and reactive to light.  Neck: Normal range of motion and phonation normal. Neck supple.  Cardiovascular: Normal rate and regular rhythm.  Pulmonary/Chest: Effort normal and breath sounds normal. She exhibits no tenderness.  Abdominal: Soft. She exhibits no distension. There is no tenderness. There is no guarding.  Musculoskeletal: Normal range of motion.  Neurological: She is alert and oriented to person, place, and time. She exhibits normal muscle tone.  No dysarthria, aphasia or nystagmus.  Skin: Skin is warm and dry.  Psychiatric: She has a normal mood and affect. Her behavior is normal. Judgment and thought  content normal.  Nursing note and vitals reviewed.    ED Treatments / Results  Labs (all labs ordered are listed, but only abnormal results are displayed) Labs Reviewed  CBC WITH DIFFERENTIAL/PLATELET - Abnormal; Notable for the following components:      Result Value   RBC 3.68 (*)    Hemoglobin 6.9 (*)    HCT 25.2 (*)    MCV 68.5 (*)    MCH 18.8 (*)    MCHC 27.4 (*)    RDW 18.2 (*)    Platelets 475 (*)    All other components within normal limits  BASIC METABOLIC PANEL - Abnormal; Notable for the following components:   Calcium 8.6 (*)    All other components within normal limits     EKG  EKG Interpretation None       Radiology Dg Chest 2 View  Result Date: 02/22/2017 CLINICAL DATA:  Cough, fever. EXAM: CHEST  2 VIEW COMPARISON:  Radiographs of Aug 02, 2014. FINDINGS: The heart size and mediastinal contours are within normal limits. Both lungs are clear. No pneumothorax or pleural effusion is noted. The visualized skeletal structures are unremarkable. IMPRESSION: No active cardiopulmonary disease. Electronically Signed   By: Lupita RaiderJames  Green Jr, M.D.   On: 02/22/2017 20:03    Procedures Procedures (including critical care time)  Medications Ordered in ED Medications - No data to display   Initial Impression / Assessment and Plan / ED Course  I have reviewed the triage vital signs and the nursing notes.  Pertinent labs & imaging results that were available during my care of the patient were reviewed by me and considered in my medical decision making (see chart for details).      Patient Vitals for the past 24 hrs:  BP Temp Temp src Pulse Resp SpO2 Height Weight  02/22/17 2100 (!) 162/98 - - 66 - 100 % - -  02/22/17 2015 - - - 75 - 98 % - -  02/22/17 2000 - - - 61 - 100 % - -  02/22/17 1945 - - - (!) 59 - 100 % - -  02/22/17 1922 - - - (!) 55 - 100 % - -  02/22/17 1921 - - - 61 - 100 % - -  02/22/17 1920 - - - 60 - 100 % - -  02/22/17 1919 - - - 70 - 100 % - -  02/22/17 1918 - - - 68 - 100 % - -  02/22/17 1917 - - - 68 - 100 % - -  02/22/17 1916 - - - 62 - 100 % - -  02/22/17 1915 - - - 76 - 100 % - -  02/22/17 1914 - - - 63 - 98 % - -  02/22/17 1913 (!) 187/103 - - 77 18 100 % - -  02/22/17 1912 (!) 187/103 - - - - - - -  02/22/17 1609 (!) 214/99 97.8 F (36.6 C) Oral 64 18 100 % 5' 8.5" (1.74 m) 99.8 kg (220 lb)    At discharge- reevaluation with update and discussion. After initial assessment and treatment, an updated evaluation reveals no change in clinical status, she remains comfortable, findings discussed with the patient and all questions  were answered. Mancel BaleElliott Foster Sonnier      Final Clinical Impressions(s) / ED Diagnoses   Final diagnoses:  Hypertension, unspecified type  Iron deficiency anemia due to chronic blood loss  Viral upper respiratory tract infection    Patient presented for malaise  likely has multifactorial fatigue.  His blood pressure which is chronic and appears to be present without signs for hypertensive urgency.  Apparent new onset anemia associated with metromenorrhagia.  No hemodynamic instability.  Anemia can be treated as an outpatient, with iron tablets and close follow-up.  Patient with apparent URI, without evidence for pneumonia.  Nursing Notes Reviewed/ Care Coordinated Applicable Imaging Reviewed Interpretation of Laboratory Data incorporated into ED treatment  The patient appears reasonably screened and/or stabilized for discharge and I doubt any other medical condition or other [facility] requiring further screening, evaluation, or treatment in the ED at this time prior to discharge.  Plan: Home Medications-antihypertensive initiated, OTC cough medicine, avoiding decongestant.  Take iron tablet twice a day for 1 month with Colace to avoid constipation.; Home Treatments-low-salt diet; return here if the recommended treatment, does not improve the symptoms; Recommended follow up-PCP of choice as soon as possible to manage ongoing care.   ED Discharge Orders        Ordered    hydrochlorothiazide (HYDRODIURIL) 25 MG tablet  Daily     02/22/17 2048       Mancel Bale, MD 02/22/17 2119

## 2017-02-22 NOTE — ED Triage Notes (Signed)
Onset Friday cough, cold, fever, taking OTC medication, today feeling worse, with elevated BP, and noted bilateral edema

## 2017-02-22 NOTE — ED Notes (Signed)
ED Provider at bedside. 

## 2017-02-22 NOTE — ED Notes (Signed)
Pt returned from xray

## 2017-02-22 NOTE — ED Notes (Signed)
CRITICAL VALUE ALERT  Critical Value:  hbg 6.9  Date & Time Notied:  02/22/17 2024  Provider Notified: BD, RN  Orders Received/Actions taken: notified  Dr. Effie ShyWentz at 2025

## 2018-11-10 IMAGING — DX DG CHEST 2V
2 series · 2 of 2 positions shown · non-contrast
Comparison: Radiographs August 02, 2014.

CLINICAL DATA: Cough, fever.

EXAM:
CHEST  2 VIEW

[chest pa]
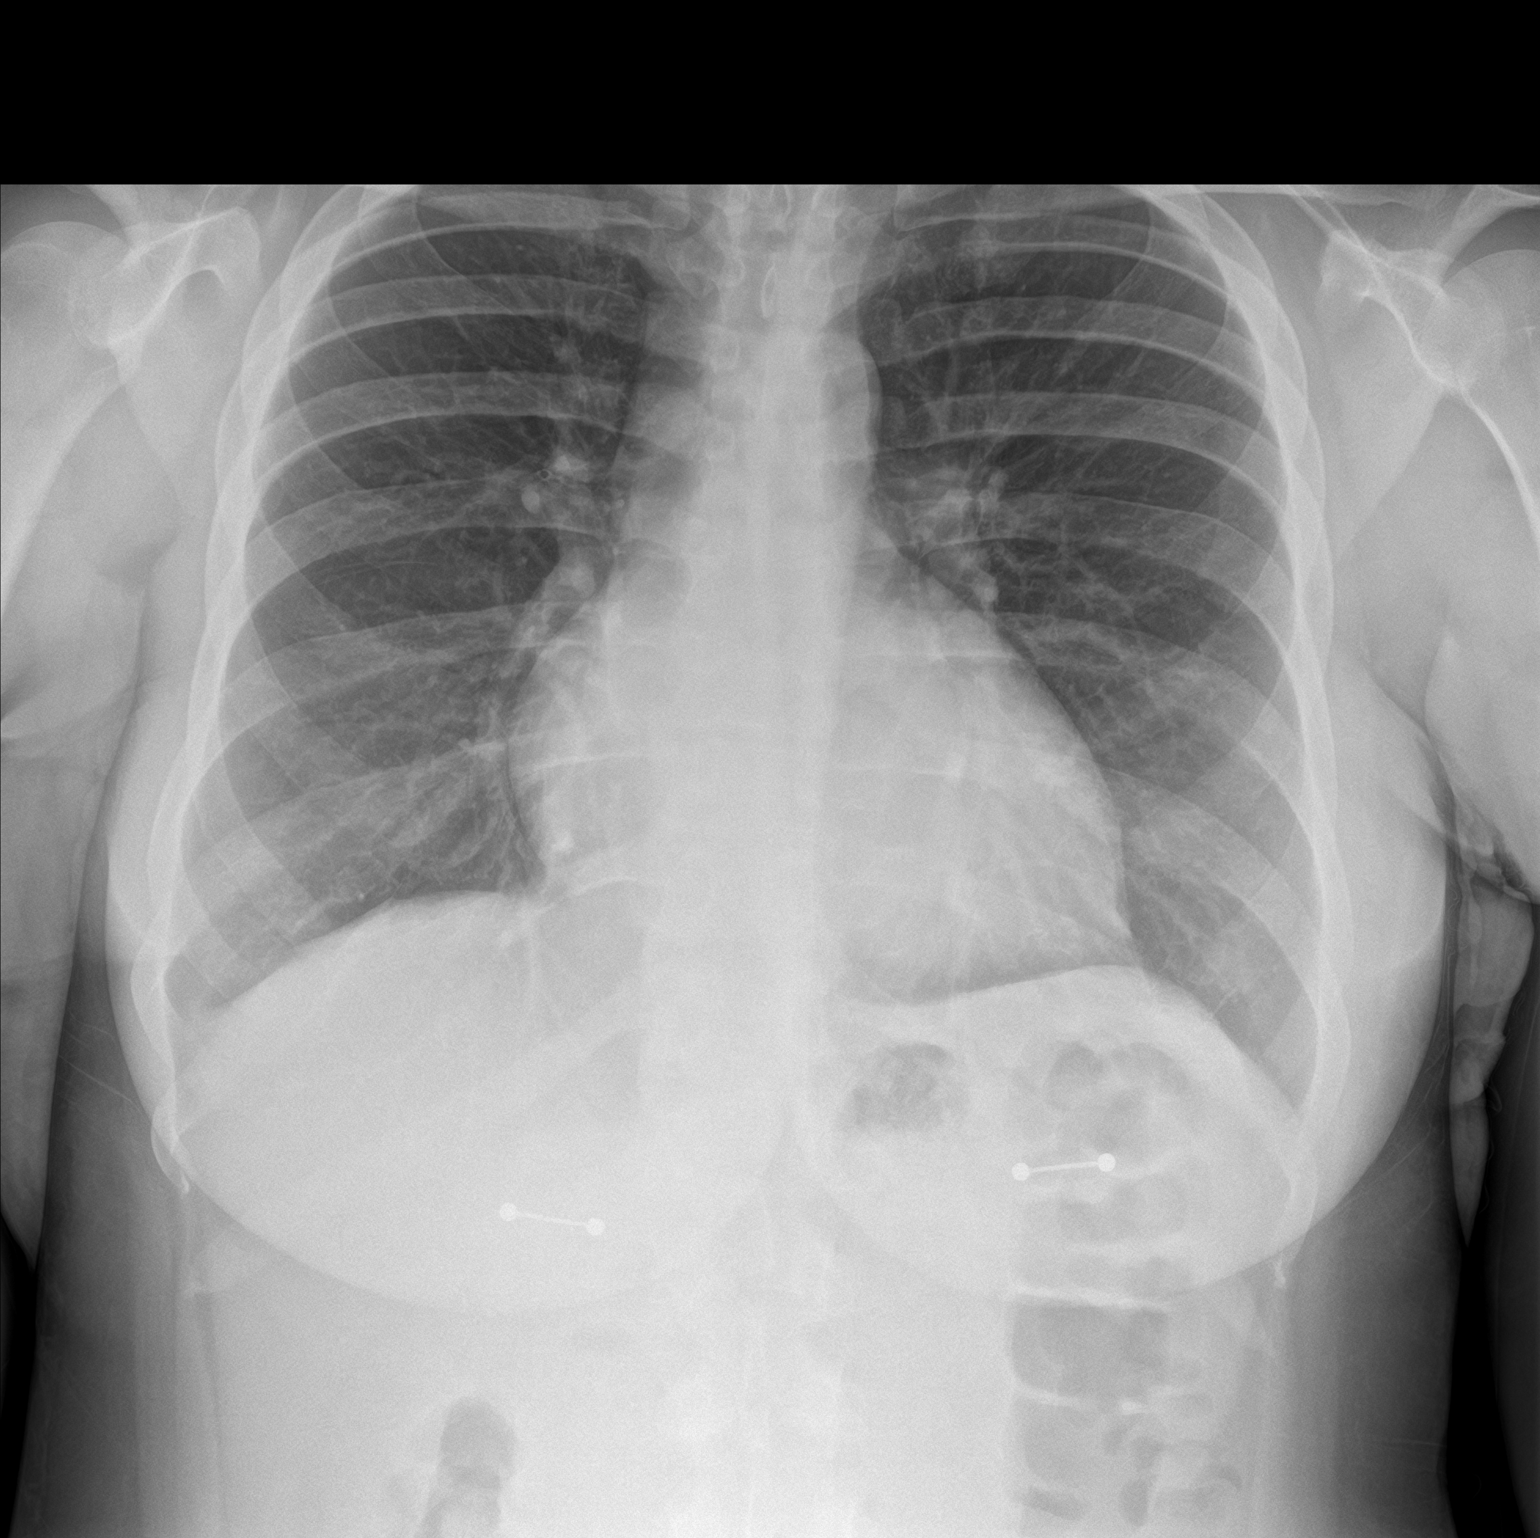

[chest lat]
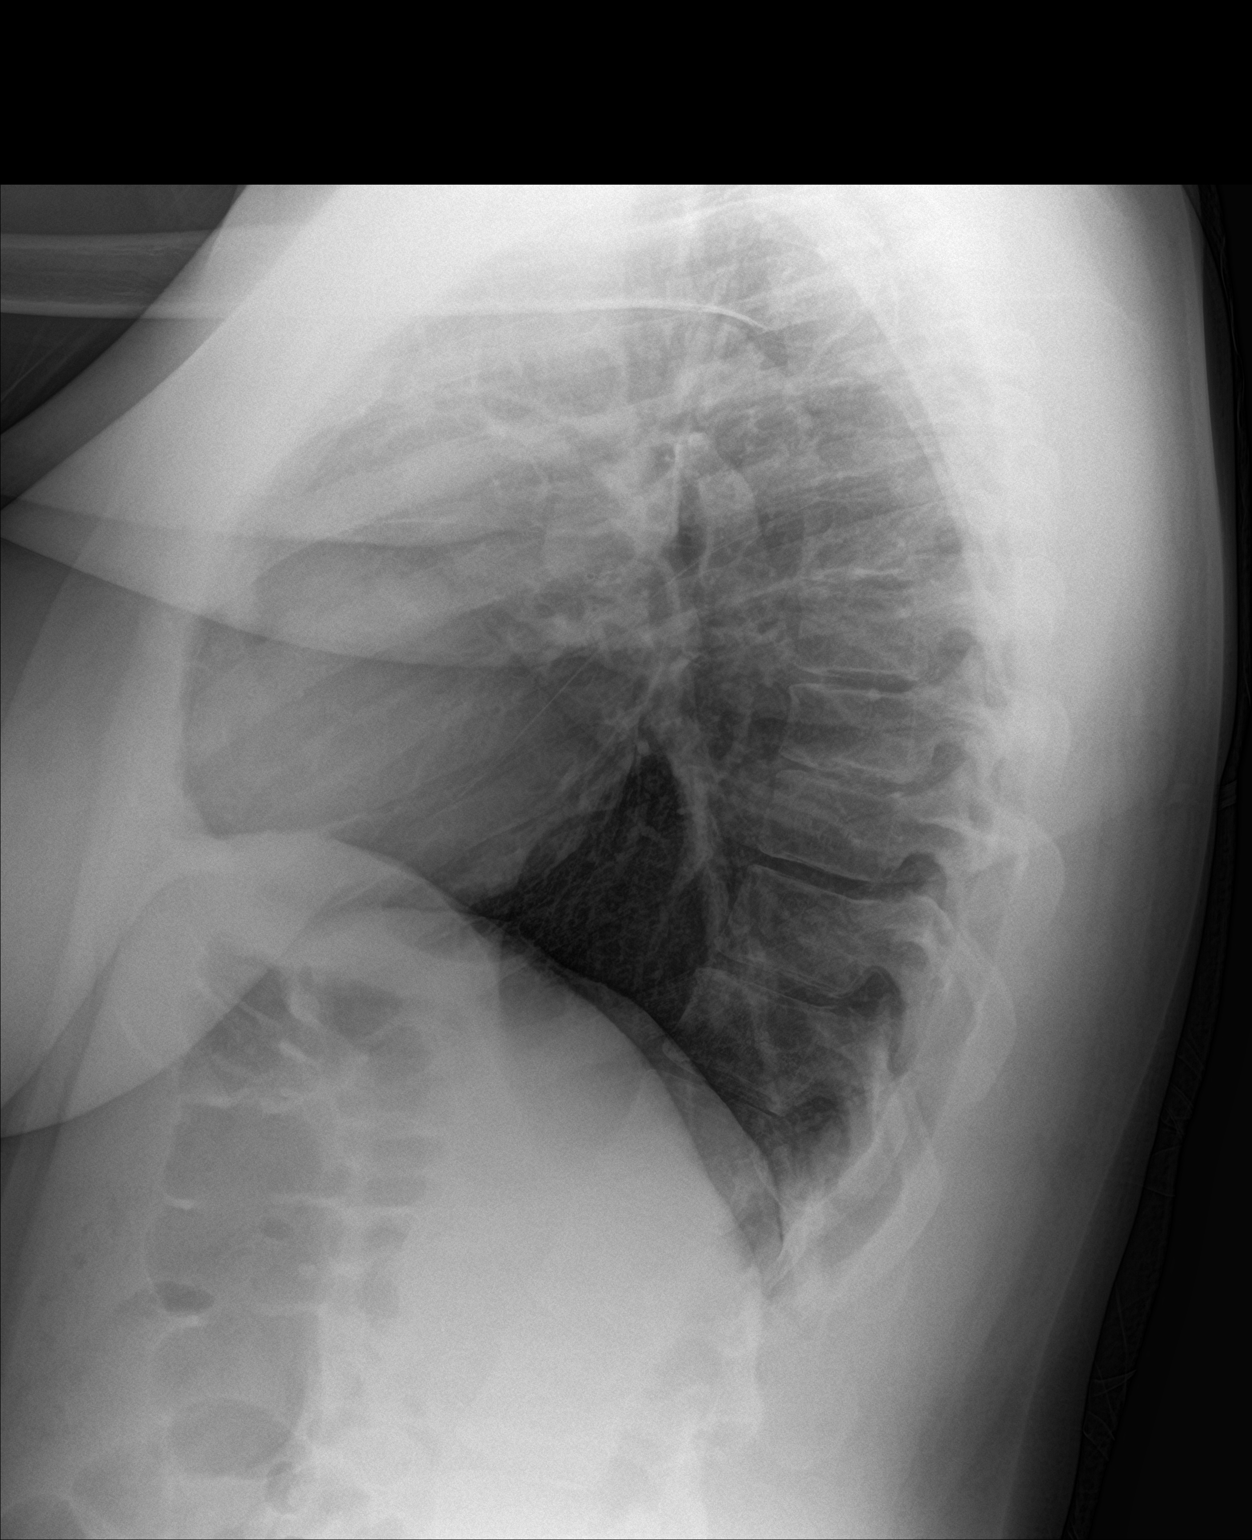

[2 of 2 positions shown; findings below may reference images not displayed]

FINDINGS: The heart size and mediastinal contours are within normal limits.
Both lungs are clear. No pneumothorax or pleural effusion is noted.
The visualized skeletal structures are unremarkable.
IMPRESSION: No active cardiopulmonary disease.

## 2024-01-18 ENCOUNTER — Encounter: Admitting: Obstetrics and Gynecology

## 2024-02-01 ENCOUNTER — Encounter: Admitting: Women's Health

## 2024-03-28 ENCOUNTER — Ambulatory Visit: Payer: Self-pay

## 2024-03-28 VITALS — BP 193/134 | HR 85 | Ht 69.0 in | Wt 277.1 lb

## 2024-03-28 DIAGNOSIS — L570 Actinic keratosis: Secondary | ICD-10-CM | POA: Diagnosis not present

## 2024-03-28 DIAGNOSIS — I10 Essential (primary) hypertension: Secondary | ICD-10-CM | POA: Diagnosis not present

## 2024-03-28 DIAGNOSIS — Z6841 Body Mass Index (BMI) 40.0 and over, adult: Secondary | ICD-10-CM | POA: Diagnosis not present

## 2024-03-28 MED ORDER — VALSARTAN-HYDROCHLOROTHIAZIDE 160-12.5 MG PO TABS: 1.0000 | ORAL_TABLET | Freq: Every day | ORAL | 2 refills | Status: AC

## 2024-03-28 NOTE — Progress Notes (Unsigned)
 "  New Patient Office Visit  Subjective    Patient ID: Brandy [name], female    DOB: 06/01/1985  Age: 38 y.o. MRN: [id number]  CC:  Chief Complaint  Patient presents with   Establish Care    Pt here to establish care, also want mole looked at inner upper left thigh    HPI Brandy [name] presents to establish care Discussed the use of AI scribe software for clinical note transcription with the patient, who gave verbal consent to proceed.  History of Present Illness    Brandy [name] is a 38 year old female with hypertension who presents with elevated blood pressure and skin lesions.  Hypertension and associated symptoms - Elevated blood pressure noted, higher than expected by the patient - History of hypertension, particularly during pregnancies - Previously treated with antihypertensive medication - Frequent headaches - Occasional leg swelling - Has compression socks but does not use them consistently  Cutaneous lesions - Concerning mole on upper left thigh, gradually increasing in size over several years - Lesion in the ear similar in appearance to the thigh mole - Describes the thigh lesion as possibly a keratosis rather than a typical mole - Desires removal of these lesions, including moles on the face      Outpatient Encounter Medications as of 03/28/2024  Medication Sig   valsartan-hydrochlorothiazide  (DIOVAN-HCT) 160-12.5 MG tablet Take 1 tablet by mouth daily.   [DISCONTINUED] hydrochlorothiazide  (HYDRODIURIL ) 25 MG tablet Take 1 tablet (25 mg total) by mouth daily.   [DISCONTINUED] Phenyleph-CPM-DM-Aspirin (ALKA-SELTZER PLUS COLD & COUGH) 7.10-29-08-325 MG TBEF Take 1 tablet by mouth once.   No facility-administered encounter medications on file as of 03/28/2024.    Past Medical History:  Diagnosis Date   Asthma    Hypertension     History reviewed. No pertinent surgical history.  History reviewed. No pertinent family history.  Social History    Socioeconomic History   Marital status: Single    Spouse name: Not on file   Number of children: Not on file   Years of education: Not on file   Highest education level: Not on file  Occupational History   Not on file  Tobacco Use   Smoking status: Every Day    Types: Cigars   Smokeless tobacco: Never  Substance and Sexual Activity   Alcohol use: Yes   Drug use: No   Sexual activity: Yes  Other Topics Concern   Not on file  Social History Narrative   Not on file   Social Drivers of Health   Tobacco Use: High Risk (03/28/2024)   Patient History    Smoking Tobacco Use: Every Day    Smokeless Tobacco Use: Never    Passive Exposure: Not on file  Financial Resource Strain: Not on file  Food Insecurity: Not on file  Transportation Needs: Not on file  Physical Activity: Not on file  Stress: Not on file  Social Connections: Not on file  Intimate Partner Violence: Not on file  Depression (PHQ2-9): Low Risk (03/28/2024)   Depression (PHQ2-9)    PHQ-2 Score: 3  Alcohol Screen: Not on file  Housing: Not on file  Utilities: Not on file  Health Literacy: Not on file    ROS      Objective    BP (!) 193/134   Pulse 85   Ht 5' 9 (1.753 m)   Wt 277 lb 1.9 oz (125.7 kg)   SpO2 94%   BMI 40.92 kg/m  Physical Exam Vitals and nursing note reviewed.  Constitutional:      Appearance: Normal appearance.  HENT:     Head: Normocephalic.     Right Ear: Tympanic membrane, ear canal and external ear normal.     Left Ear: Tympanic membrane and external ear normal.     Ears:     Comments: Skin lesion in left ear canal    Nose: Nose normal.     Mouth/Throat:     Mouth: Mucous membranes are moist.     Pharynx: Oropharynx is clear.  Eyes:     Extraocular Movements: Extraocular movements intact.     Conjunctiva/sclera: Conjunctivae normal.  Cardiovascular:     Rate and Rhythm: Normal rate and regular rhythm.  Pulmonary:     Effort: Pulmonary effort is normal.      Breath sounds: Normal breath sounds.  Musculoskeletal:     Cervical back: Normal range of motion and neck supple.  Skin:    General: Skin is warm and dry.     Findings: Lesion (large kerotosis on upper left thigh) present.  Neurological:     Mental Status: She is alert and oriented to person, place, and time.  Psychiatric:        Mood and Affect: Mood normal.        Thought Content: Thought content normal.         Assessment & Plan:   Problem List Items Addressed This Visit       Cardiovascular and Mediastinum   Essential hypertension   Relevant Medications   valsartan-hydrochlorothiazide  (DIOVAN-HCT) 160-12.5 MG tablet     Other   BMI 40.0-44.9, adult (HCC) - Primary (Chronic)   Relevant Orders   EKG 12-Lead (Completed)   Other Visit Diagnoses       Keratosis       Relevant Orders   Ambulatory referral to Dermatology       Return in about 4 weeks (around 04/25/2024) for for recheck of symptoms.   Leita Longs, FNP   "

## 2024-03-31 NOTE — Assessment & Plan Note (Signed)
 Discussed need for weight loss with healthy diet and regular exercise to reduce risk factors.

## 2024-03-31 NOTE — Assessment & Plan Note (Signed)
 Severe hypertension with BP ~200 mmHg. Family history noted. Non-compliance with lifestyle modifications. Agreed to medication and lifestyle changes. - Baseline EKG showed NSR, no ischemic changes seen. - Initiated antihypertensive medication.

## 2024-04-25 ENCOUNTER — Ambulatory Visit: Payer: Self-pay

## 2024-07-17 ENCOUNTER — Ambulatory Visit

## 2024-12-05 ENCOUNTER — Ambulatory Visit: Admitting: Physician Assistant
